# Patient Record
Sex: Male | Born: 1980 | Race: White | Hispanic: No | Marital: Married | Smoking: Former smoker
Health system: Southern US, Community
[De-identification: ages and names within clinical notes are randomized; demographics above are authoritative.]

## PROBLEM LIST (undated history)

## (undated) DIAGNOSIS — F1910 Other psychoactive substance abuse, uncomplicated: Secondary | ICD-10-CM

## (undated) DIAGNOSIS — IMO0002 Reserved for concepts with insufficient information to code with codable children: Secondary | ICD-10-CM

## (undated) DIAGNOSIS — R569 Unspecified convulsions: Secondary | ICD-10-CM

## (undated) HISTORY — PX: CHOLECYSTECTOMY: SHX55

---

## 2001-07-31 ENCOUNTER — Emergency Department (HOSPITAL_COMMUNITY): Admission: EM | Admit: 2001-07-31 | Discharge: 2001-07-31 | Payer: Self-pay | Admitting: Emergency Medicine

## 2001-10-30 ENCOUNTER — Emergency Department (HOSPITAL_COMMUNITY): Admission: EM | Admit: 2001-10-30 | Discharge: 2001-10-30 | Payer: Self-pay | Admitting: Emergency Medicine

## 2001-10-30 ENCOUNTER — Encounter: Payer: Self-pay | Admitting: Emergency Medicine

## 2002-01-07 ENCOUNTER — Encounter: Payer: Self-pay | Admitting: Emergency Medicine

## 2002-01-07 ENCOUNTER — Emergency Department (HOSPITAL_COMMUNITY): Admission: EM | Admit: 2002-01-07 | Discharge: 2002-01-07 | Payer: Self-pay | Admitting: Emergency Medicine

## 2002-07-03 ENCOUNTER — Emergency Department (HOSPITAL_COMMUNITY): Admission: EM | Admit: 2002-07-03 | Discharge: 2002-07-03 | Payer: Self-pay | Admitting: Emergency Medicine

## 2002-07-03 ENCOUNTER — Encounter: Payer: Self-pay | Admitting: Emergency Medicine

## 2003-03-17 ENCOUNTER — Emergency Department (HOSPITAL_COMMUNITY): Admission: EM | Admit: 2003-03-17 | Discharge: 2003-03-17 | Payer: Self-pay | Admitting: Emergency Medicine

## 2003-08-24 ENCOUNTER — Emergency Department (HOSPITAL_COMMUNITY): Admission: EM | Admit: 2003-08-24 | Discharge: 2003-08-24 | Payer: Self-pay | Admitting: Emergency Medicine

## 2003-10-12 ENCOUNTER — Emergency Department (HOSPITAL_COMMUNITY): Admission: EM | Admit: 2003-10-12 | Discharge: 2003-10-13 | Payer: Self-pay | Admitting: Emergency Medicine

## 2003-10-22 ENCOUNTER — Emergency Department (HOSPITAL_COMMUNITY): Admission: EM | Admit: 2003-10-22 | Discharge: 2003-10-22 | Payer: Self-pay | Admitting: *Deleted

## 2003-11-04 ENCOUNTER — Emergency Department (HOSPITAL_COMMUNITY): Admission: EM | Admit: 2003-11-04 | Discharge: 2003-11-05 | Payer: Self-pay | Admitting: Emergency Medicine

## 2003-12-01 ENCOUNTER — Ambulatory Visit (HOSPITAL_COMMUNITY): Admission: EM | Admit: 2003-12-01 | Discharge: 2003-12-01 | Payer: Self-pay | Admitting: Emergency Medicine

## 2003-12-31 ENCOUNTER — Emergency Department (HOSPITAL_COMMUNITY): Admission: EM | Admit: 2003-12-31 | Discharge: 2003-12-31 | Payer: Self-pay | Admitting: *Deleted

## 2004-06-02 ENCOUNTER — Emergency Department (HOSPITAL_COMMUNITY): Admission: EM | Admit: 2004-06-02 | Discharge: 2004-06-02 | Payer: Self-pay | Admitting: Emergency Medicine

## 2004-07-07 ENCOUNTER — Emergency Department (HOSPITAL_COMMUNITY): Admission: EM | Admit: 2004-07-07 | Discharge: 2004-07-07 | Payer: Self-pay | Admitting: Emergency Medicine

## 2004-08-27 ENCOUNTER — Emergency Department (HOSPITAL_COMMUNITY): Admission: EM | Admit: 2004-08-27 | Discharge: 2004-08-27 | Payer: Self-pay | Admitting: Emergency Medicine

## 2004-08-27 ENCOUNTER — Emergency Department (HOSPITAL_COMMUNITY): Admission: EM | Admit: 2004-08-27 | Discharge: 2004-08-27 | Payer: Self-pay | Admitting: *Deleted

## 2004-09-16 ENCOUNTER — Emergency Department (HOSPITAL_COMMUNITY): Admission: EM | Admit: 2004-09-16 | Discharge: 2004-09-16 | Payer: Self-pay | Admitting: Emergency Medicine

## 2006-10-05 ENCOUNTER — Emergency Department (HOSPITAL_COMMUNITY): Admission: EM | Admit: 2006-10-05 | Discharge: 2006-10-06 | Payer: Self-pay | Admitting: Emergency Medicine

## 2007-03-10 ENCOUNTER — Emergency Department (HOSPITAL_COMMUNITY): Admission: EM | Admit: 2007-03-10 | Discharge: 2007-03-10 | Payer: Self-pay | Admitting: Emergency Medicine

## 2007-04-13 ENCOUNTER — Emergency Department (HOSPITAL_COMMUNITY): Admission: EM | Admit: 2007-04-13 | Discharge: 2007-04-14 | Payer: Self-pay | Admitting: Emergency Medicine

## 2008-01-24 ENCOUNTER — Emergency Department (HOSPITAL_COMMUNITY): Admission: EM | Admit: 2008-01-24 | Discharge: 2008-01-25 | Payer: Self-pay | Admitting: Emergency Medicine

## 2008-08-21 ENCOUNTER — Emergency Department (HOSPITAL_COMMUNITY): Admission: EM | Admit: 2008-08-21 | Discharge: 2008-08-22 | Payer: Self-pay | Admitting: Emergency Medicine

## 2008-08-31 ENCOUNTER — Emergency Department (HOSPITAL_COMMUNITY): Admission: EM | Admit: 2008-08-31 | Discharge: 2008-08-31 | Payer: Self-pay | Admitting: Emergency Medicine

## 2008-09-29 ENCOUNTER — Emergency Department (HOSPITAL_BASED_OUTPATIENT_CLINIC_OR_DEPARTMENT_OTHER): Admission: EM | Admit: 2008-09-29 | Discharge: 2008-09-29 | Payer: Self-pay | Admitting: Emergency Medicine

## 2010-04-16 ENCOUNTER — Emergency Department (INDEPENDENT_AMBULATORY_CARE_PROVIDER_SITE_OTHER): Payer: Self-pay

## 2010-04-16 ENCOUNTER — Emergency Department (HOSPITAL_BASED_OUTPATIENT_CLINIC_OR_DEPARTMENT_OTHER)
Admission: EM | Admit: 2010-04-16 | Discharge: 2010-04-16 | Disposition: A | Discharge status: Home / Self Care | Payer: Self-pay | Attending: Emergency Medicine | Admitting: Emergency Medicine

## 2010-04-16 DIAGNOSIS — R071 Chest pain on breathing: Secondary | ICD-10-CM | POA: Insufficient documentation

## 2010-04-16 DIAGNOSIS — F17200 Nicotine dependence, unspecified, uncomplicated: Secondary | ICD-10-CM | POA: Insufficient documentation

## 2010-05-30 NOTE — Op Note (Signed)
NAMECHRISTOFFER, CURRIER NO.:  [id number]   MEDICAL RECORD NO.:  [id number]          PATIENT TYPE:  EMS   LOCATION:  MAJO                         FACILITY:  [facility]   PHYSICIAN:  Lubertha Basque. Dalldorf, M.D.DATE OF BIRTH:  12/18/1980   DATE OF PROCEDURE:  03/10/2007  DATE OF DISCHARGE:                               OPERATIVE REPORT   PREOPERATIVE DIAGNOSIS:  Right shoulder glenohumeral dislocation.   POSTOPERATIVE DIAGNOSIS:  Right shoulder glenohumeral dislocation.   PROCEDURE:  Right shoulder closed reduction under anesthesia.   ATTENDING SURGEON:  Lubertha Basque. Jerl Santos, M.D.   ANESTHESIA:  General.   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  He says this has come out  probably 40 times.  He has required reduction in the emergency room and  in the operating room through many different orthopedic surgeons in  town.  At this point his shoulder dislocated rolling over while  sleeping.  He presented to the [facility] emergency room.  The emergency  physicians could not relocate despite significant sedation in the  emergency room.  Orthopedics was consulted.  He is offered closed  reduction under general anesthesia.   SUMMARY OF FINDINGS AND PROCEDURE:  Under general anesthesia closed  reduction was performed with some moderate difficulty.  X-rays confirmed  reduction of the shoulder at the end of the case.   DESCRIPTION OF PROCEDURE:  The patient was taken to the operating suite  where general anesthetic was applied with IV medication and by mask.  He  was positioned in beach-chair position.  With some traction and  posterior pressure, the shoulder clunked back into place.  He exhibits  full, smooth motion thereafter.  Fluoroscopic and plain film view showed  a well reduced shoulder joint.  He was placed into a sling and swath.  Estimated blood loss was 0 and intraoperative fluids can be obtained  from anesthesia records.   DISPOSITION:  The patient was taken to recovery in stable condition.  He  was to go home same-day and follow up in the office in less than a week.      Lubertha Basque Jerl Santos, M.D.  Electronically Signed     PGD/MEDQ  D:  03/10/2007  T:  03/11/2007  Job:  [id number]

## 2010-06-02 NOTE — Op Note (Signed)
NAMEMarland Kitchen  MACGREGOR, AESCHLIMAN NO.:  [id number]   MEDICAL RECORD NO.:  [id number]          PATIENT TYPE:  EMS   LOCATION:  ED                           FACILITY:  [facility]   PHYSICIAN:  Vania Rea. Supple, M.D.  DATE OF BIRTH:  [birth year]   DATE OF PROCEDURE:  12/01/2003  DATE OF DISCHARGE:                                 OPERATIVE REPORT   PREOPERATIVE DIAGNOSIS:  Chronic recurrent left shoulder dislocations with a  currently irreducible left shoulder anterior and inferior dislocation.   POSTOPERATIVE DIAGNOSIS:  Chronic recurrent left shoulder dislocations with  a currently irreducible left shoulder anterior and inferior dislocation.   PROCEDURE:  Closed reduction of left shoulder anterior-inferior dislocation.   SURGEON OF RECORD:  Francena Hanly, M.D.   ANESTHESIA:  Mask general.   INTRAOPERATIVE FINDINGS:  Gross instability of the glenohumeral ligamentous  complex allowing recurrent dislocations of left shoulder with simply gravity  applied.   BRIEF HISTORY:  Mr. Chopin is a 30 year old male, who has had chronic  recurrent bilateral shoulder dislocations and by his report, has had over 30  dislocations on the left and 15 on the right.  He has been to the [facility] Emergency Room over 8 times in the last year with recurrent  dislocations of the left shoulder.  He has followed up with a number of  different local orthopedists but has been unwilling to undergo any type of  surgical stabilization.  Presents today complaining of left shoulder  dislocation and has been evaluated in the ER and had multiple attempts by  the ER physician with closed reduction without success.  Orthopedics is  consulted and presents now for a planned closed reduction under anesthesia.   Preoperatively, I discussed with Mr. Postlewaite treatment options and risks  versus benefits thereof.  Possible complications of recurrent dislocation  and the likely need for some type of surgical  stabilization are reviewed.  He understands, accepts, and agrees with our planned procedure.   PROCEDURE IN DETAIL:  After undergoing routine preoperative evaluation, the  patient brought to the operating room and underwent the induction of a mask  general anesthesia with appropriate relaxation techniques.  The left  shoulder readily reduced and on examination under anesthesia, the left  shoulder was grossly unstable with recurrent anterior-inferior dislocation  with simply elevation of the arm and gravity applied.  There was no obvious  posterior instability.  Fluoroscopic images were then obtained with the  shoulder both in proper reduction as well as with recurrent dislocation.  Due to the extreme instability of the shoulder, it was unclear whether the  shoulder would remain reduced, and most likely he will require some type of  surgical stabilization.   At this point, the patient was then awakened and taken to the recovery room  in stable condition with a shoulder immobilizer applied to the left upper  extremity.     Sage.Pita   KMS/MEDQ  D:  12/01/2003  T:  12/01/2003  Job:  [id number]

## 2010-10-09 LAB — BASIC METABOLIC PANEL
Chloride: 98
GFR calc Af Amer: >60
GFR calc non Af Amer: >60
Potassium: 3.8
Sodium: 134 — ABNORMAL LOW

## 2010-10-09 LAB — CBC
HCT: 47.9
MCV: 99
RBC: 4.84
WBC: 9.9

## 2010-10-09 LAB — DIFFERENTIAL
Eosinophils Absolute: 0.1
Eosinophils Relative: 1
Lymphocytes Relative: 19
Lymphs Abs: 1.9
Monocytes Relative: 6

## 2010-10-09 LAB — URINALYSIS, ROUTINE W REFLEX MICROSCOPIC
Glucose, UA: NEGATIVE
Hgb urine dipstick: NEGATIVE
Protein, ur: NEGATIVE
Specific Gravity, Urine: 1.043 — ABNORMAL HIGH
pH: 6

## 2010-10-09 LAB — ETHANOL: Alcohol, Ethyl (B): <5

## 2010-10-09 LAB — RAPID URINE DRUG SCREEN, HOSP PERFORMED
Barbiturates: NOT DETECTED
Benzodiazepines: POSITIVE — AB

## 2010-10-26 LAB — ETHANOL: Alcohol, Ethyl (B): 286 — ABNORMAL HIGH

## 2011-09-20 ENCOUNTER — Emergency Department (HOSPITAL_COMMUNITY)
Admission: EM | Admit: 2011-09-20 | Discharge: 2011-09-20 | Disposition: A | Discharge status: Home / Self Care | Payer: Self-pay | Attending: Emergency Medicine | Admitting: Emergency Medicine

## 2011-09-20 ENCOUNTER — Encounter (HOSPITAL_COMMUNITY): Payer: Self-pay | Admitting: Emergency Medicine

## 2011-09-20 DIAGNOSIS — F17200 Nicotine dependence, unspecified, uncomplicated: Secondary | ICD-10-CM | POA: Insufficient documentation

## 2011-09-20 DIAGNOSIS — R569 Unspecified convulsions: Secondary | ICD-10-CM | POA: Insufficient documentation

## 2011-09-20 DIAGNOSIS — Z760 Encounter for issue of repeat prescription: Secondary | ICD-10-CM | POA: Insufficient documentation

## 2011-09-20 HISTORY — DX: Unspecified convulsions: R56.9

## 2011-09-20 MED ORDER — PHENYTOIN SODIUM EXTENDED 100 MG PO CAPS: 100.0000 mg | ORAL_CAPSULE | Freq: Three times a day (TID) | ORAL | Status: AC

## 2011-09-20 NOTE — ED Notes (Signed)
Needs dilantin refill lost insurance

## 2011-09-20 NOTE — ED Notes (Signed)
Pt d/c home in NAD. Pt voiced understanding of d/c instructions and follow up care.  

## 2011-09-20 NOTE — ED Provider Notes (Signed)
History   Scribed for [name], MD, the patient was seen in room TR08C/TR08C . This chart was scribed by [name].    CSN: [id number]  Arrival date & time 09/20/11  1107   First MD Initiated Contact with Patient 09/20/11 1150      Chief Complaint  Patient presents with  . Medication Refill    (Consider location/radiation/quality/duration/timing/severity/associated sxs/prior treatment) The history is provided by the patient.   Kurt Lindsey is a 31 y.o. male who presents to the Emergency Department for a refill of Dilantin today. Pt states he recently saw a neurologist who prescribed him Lamictal for his seizures, but he did not fill it because he "could not afford it." Pt is requesting to be put on Dilantin again.   Past Medical History  Diagnosis Date  . Seizures     No past surgical history on file.  No family history on file.  History  Substance Use Topics  . Smoking status: Current Everyday Smoker  . Smokeless tobacco: Not on file  . Alcohol Use: Yes      Review of Systems  Constitutional: Negative.   HENT: Negative.   Respiratory: Negative.   Cardiovascular: Negative.   Gastrointestinal: Negative.   Musculoskeletal: Negative.   Skin: Negative.   Neurological: Negative.   Hematological: Negative.   Psychiatric/Behavioral: Negative.   All other systems reviewed and are negative.    Allergies  Review of patient's allergies indicates no known allergies.  Home Medications   Current Outpatient Rx  Name Route Sig Dispense Refill  . PHENYTOIN SODIUM EXTENDED 100 MG PO CAPS Oral Take 100 mg by mouth daily.      BP 135/85  Pulse 89  Temp 98.3 F (36.8 C)  Resp 16  SpO2 99%  Physical Exam  Nursing note and vitals reviewed. Constitutional: He is oriented to person, place, and time. He appears well-developed and well-nourished.  HENT:  Head: Normocephalic and atraumatic.  Eyes: Conjunctivae and EOM are normal.  Neck: Normal range  of motion.  Cardiovascular: Normal rate.   Pulmonary/Chest: Effort normal.  Abdominal: Soft.  Musculoskeletal: Normal range of motion.  Neurological: He is alert and oriented to person, place, and time.  Skin: Skin is warm and dry.  Psychiatric: He has a normal mood and affect.    ED Course  Procedures (including critical care time)  Labs Reviewed - No data to display No results found.   No diagnosis found.    MDM  pts dilantin rx refilled and he was given f/u         [name], MD 09/20/11 1208

## 2011-09-20 NOTE — ED Notes (Signed)
Pt needs med refill, has not run out yet, pt denies any other complaints at this time.

## 2011-12-31 ENCOUNTER — Emergency Department (HOSPITAL_COMMUNITY)
Admission: EM | Admit: 2011-12-31 | Discharge: 2012-01-01 | Disposition: A | Discharge status: Home / Self Care | Payer: Self-pay | Attending: Emergency Medicine | Admitting: Emergency Medicine

## 2011-12-31 ENCOUNTER — Encounter (HOSPITAL_COMMUNITY): Payer: Self-pay | Admitting: Emergency Medicine

## 2011-12-31 DIAGNOSIS — W292XXA Contact with other powered household machinery, initial encounter: Secondary | ICD-10-CM | POA: Insufficient documentation

## 2011-12-31 DIAGNOSIS — S61012A Laceration without foreign body of left thumb without damage to nail, initial encounter: Secondary | ICD-10-CM

## 2011-12-31 DIAGNOSIS — Y9389 Activity, other specified: Secondary | ICD-10-CM | POA: Insufficient documentation

## 2011-12-31 DIAGNOSIS — G40909 Epilepsy, unspecified, not intractable, without status epilepticus: Secondary | ICD-10-CM | POA: Insufficient documentation

## 2011-12-31 DIAGNOSIS — F17200 Nicotine dependence, unspecified, uncomplicated: Secondary | ICD-10-CM | POA: Insufficient documentation

## 2011-12-31 DIAGNOSIS — S61209A Unspecified open wound of unspecified finger without damage to nail, initial encounter: Secondary | ICD-10-CM | POA: Insufficient documentation

## 2011-12-31 DIAGNOSIS — Z79899 Other long term (current) drug therapy: Secondary | ICD-10-CM | POA: Insufficient documentation

## 2011-12-31 DIAGNOSIS — Y92009 Unspecified place in unspecified non-institutional (private) residence as the place of occurrence of the external cause: Secondary | ICD-10-CM | POA: Insufficient documentation
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
## 2012-01-01 MED ORDER — LIDOCAINE HCL (PF) 1 % IJ SOLN
5.0000 mL | Freq: Once | INTRAMUSCULAR | Status: AC
  Administered 2012-01-01: 5 mL via INTRADERMAL
  Filled 2012-01-01: qty 5

## 2012-01-01 MED ORDER — TRAMADOL-ACETAMINOPHEN 37.5-325 MG PO TABS: ORAL_TABLET | ORAL | Status: DC
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
## 2012-01-01 NOTE — ED Notes (Signed)
Triple antibiotic and dressing applied.

## 2012-01-01 NOTE — ED Notes (Signed)
Patient states he was using a sharp kitchen knife and cut his left thumb.  Area cleaned and dressing applied
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
## 2015-03-07 ENCOUNTER — Emergency Department (HOSPITAL_COMMUNITY)
Admission: EM | Admit: 2015-03-07 | Discharge: 2015-03-07 | Disposition: A | Discharge status: Home / Self Care | Payer: Self-pay | Attending: Emergency Medicine | Admitting: Emergency Medicine

## 2015-03-07 ENCOUNTER — Encounter (HOSPITAL_COMMUNITY): Payer: Self-pay

## 2015-03-07 DIAGNOSIS — F17200 Nicotine dependence, unspecified, uncomplicated: Secondary | ICD-10-CM | POA: Insufficient documentation

## 2015-03-07 DIAGNOSIS — F1123 Opioid dependence with withdrawal: Secondary | ICD-10-CM | POA: Insufficient documentation

## 2015-03-07 LAB — CBC WITH DIFFERENTIAL/PLATELET
Basophils Absolute: 0 10*3/uL (ref 0.0–0.1)
Basophils Relative: 0 %
Eosinophils Absolute: 0.2 10*3/uL (ref 0.0–0.7)
Eosinophils Relative: 3 %
HEMATOCRIT: 46.3 % (ref 39.0–52.0)
Hemoglobin: 15.9 g/dL (ref 13.0–17.0)
LYMPHS PCT: 31 %
Lymphs Abs: 2.2 10*3/uL (ref 0.7–4.0)
MCH: 33.2 pg (ref 26.0–34.0)
MCHC: 34.3 g/dL (ref 30.0–36.0)
MCV: 96.7 fL (ref 78.0–100.0)
MONO ABS: 0.6 10*3/uL (ref 0.1–1.0)
MONOS PCT: 9 %
NEUTROS ABS: 4 10*3/uL (ref 1.7–7.7)
Neutrophils Relative %: 57 %
Platelets: 211 10*3/uL (ref 150–400)
RBC: 4.79 MIL/uL (ref 4.22–5.81)
RDW: 13.4 % (ref 11.5–15.5)
WBC: 7 10*3/uL (ref 4.0–10.5)

## 2015-03-07 LAB — ETHANOL: Alcohol, Ethyl (B): <5 mg/dL (ref <5)

## 2015-03-07 LAB — RAPID URINE DRUG SCREEN, HOSP PERFORMED
Amphetamines: NOT DETECTED
BARBITURATES: NOT DETECTED
Benzodiazepines: POSITIVE — AB
Cocaine: POSITIVE — AB
Opiates: POSITIVE — AB
Tetrahydrocannabinol: NOT DETECTED

## 2015-03-07 LAB — COMPREHENSIVE METABOLIC PANEL
ALBUMIN: 3.8 g/dL (ref 3.5–5.0)
ALT: 80 U/L — ABNORMAL HIGH (ref 17–63)
AST: 41 U/L (ref 15–41)
Alkaline Phosphatase: 170 U/L — ABNORMAL HIGH (ref 38–126)
Anion gap: 10 (ref 5–15)
BILIRUBIN TOTAL: 0.3 mg/dL (ref 0.3–1.2)
BUN: 19 mg/dL (ref 6–20)
CHLORIDE: 101 mmol/L (ref 101–111)
CO2: 26 mmol/L (ref 22–32)
Calcium: 9.1 mg/dL (ref 8.9–10.3)
Creatinine, Ser: 0.91 mg/dL (ref 0.61–1.24)
GFR calc Af Amer: >60 mL/min (ref >60)
GFR calc non Af Amer: >60 mL/min (ref >60)
GLUCOSE: 115 mg/dL — AB (ref 65–99)
POTASSIUM: 4 mmol/L (ref 3.5–5.1)
Sodium: 137 mmol/L (ref 135–145)
TOTAL PROTEIN: 7 g/dL (ref 6.5–8.1)

## 2015-03-07 LAB — SALICYLATE LEVEL: Salicylate Lvl: <4 mg/dL (ref 2.8–30.0)

## 2015-03-07 LAB — ACETAMINOPHEN LEVEL

## 2015-03-07 NOTE — ED Notes (Signed)
Patient left without being seen.

## 2015-03-07 NOTE — ED Notes (Signed)
Patient here requesting detox for opiate and benzos, here for medical clearance. Denies any SI

## 2015-10-05 ENCOUNTER — Encounter (HOSPITAL_BASED_OUTPATIENT_CLINIC_OR_DEPARTMENT_OTHER): Payer: Self-pay

## 2015-10-05 ENCOUNTER — Emergency Department (HOSPITAL_BASED_OUTPATIENT_CLINIC_OR_DEPARTMENT_OTHER)
Admission: EM | Admit: 2015-10-05 | Discharge: 2015-10-06 | Disposition: A | Discharge status: Home / Self Care | Payer: Self-pay | Attending: Emergency Medicine | Admitting: Emergency Medicine

## 2015-10-05 DIAGNOSIS — K2970 Gastritis, unspecified, without bleeding: Secondary | ICD-10-CM | POA: Insufficient documentation

## 2015-10-05 DIAGNOSIS — Z79899 Other long term (current) drug therapy: Secondary | ICD-10-CM | POA: Insufficient documentation

## 2015-10-05 DIAGNOSIS — F17200 Nicotine dependence, unspecified, uncomplicated: Secondary | ICD-10-CM | POA: Insufficient documentation

## 2015-10-05 DIAGNOSIS — F1910 Other psychoactive substance abuse, uncomplicated: Secondary | ICD-10-CM | POA: Insufficient documentation

## 2015-10-05 LAB — CBC WITH DIFFERENTIAL/PLATELET
Basophils Absolute: 0 10*3/uL (ref 0.0–0.1)
Basophils Relative: 0 %
EOS PCT: 2 %
Eosinophils Absolute: 0.2 10*3/uL (ref 0.0–0.7)
HEMATOCRIT: 48.3 % (ref 39.0–52.0)
Hemoglobin: 17 g/dL (ref 13.0–17.0)
LYMPHS ABS: 2.6 10*3/uL (ref 0.7–4.0)
LYMPHS PCT: 35 %
MCH: 35.1 pg — AB (ref 26.0–34.0)
MCHC: 35.2 g/dL (ref 30.0–36.0)
MCV: 99.6 fL (ref 78.0–100.0)
MONO ABS: 0.7 10*3/uL (ref 0.1–1.0)
Monocytes Relative: 10 %
NEUTROS ABS: 4 10*3/uL (ref 1.7–7.7)
Neutrophils Relative %: 53 %
PLATELETS: 194 10*3/uL (ref 150–400)
RBC: 4.85 MIL/uL (ref 4.22–5.81)
RDW: 13.5 % (ref 11.5–15.5)
WBC: 7.6 10*3/uL (ref 4.0–10.5)

## 2015-10-05 LAB — COMPREHENSIVE METABOLIC PANEL
ALT: 63 U/L (ref 17–63)
AST: 45 U/L — AB (ref 15–41)
Albumin: 3.9 g/dL (ref 3.5–5.0)
Alkaline Phosphatase: 108 U/L (ref 38–126)
Anion gap: 7 (ref 5–15)
BILIRUBIN TOTAL: 0.7 mg/dL (ref 0.3–1.2)
BUN: 15 mg/dL (ref 6–20)
CALCIUM: 8.7 mg/dL — AB (ref 8.9–10.3)
CHLORIDE: 106 mmol/L (ref 101–111)
CO2: 24 mmol/L (ref 22–32)
CREATININE: 0.81 mg/dL (ref 0.61–1.24)
Glucose, Bld: 81 mg/dL (ref 65–99)
Potassium: 4 mmol/L (ref 3.5–5.1)
Sodium: 137 mmol/L (ref 135–145)
TOTAL PROTEIN: 7.1 g/dL (ref 6.5–8.1)

## 2015-10-05 LAB — LIPASE, BLOOD: Lipase: 31 U/L (ref 11–51)

## 2015-10-05 LAB — ETHANOL: ALCOHOL ETHYL (B): 93 mg/dL — AB (ref <5)

## 2015-10-05 MED ORDER — ONDANSETRON 4 MG PO TBDP
4.0000 mg | ORAL_TABLET | Freq: Once | ORAL | Status: AC
  Administered 2015-10-05: 4 mg via ORAL
  Filled 2015-10-05: qty 1

## 2015-10-05 MED ORDER — GI COCKTAIL ~~LOC~~
30.0000 mL | Freq: Once | ORAL | Status: AC
  Administered 2015-10-05: 30 mL via ORAL
  Filled 2015-10-05: qty 30

## 2015-10-05 MED ORDER — PANTOPRAZOLE SODIUM 40 MG PO TBEC
80.0000 mg | DELAYED_RELEASE_TABLET | Freq: Once | ORAL | Status: AC
  Administered 2015-10-05: 80 mg via ORAL
  Filled 2015-10-05: qty 2

## 2015-10-05 NOTE — ED Provider Notes (Signed)
By signing my name below, I, Freida BusmanDiana Omoyeni, attest that this documentation has been prepared under the direction and in the presence of Teyanna Thielman N Kateryna Grantham, DO . Electronically Signed: Freida Busmaniana Omoyeni, Scribe. 10/05/2015. 11:10 PM.  TIME SEEN: 11:03 PM  CHIEF COMPLAINT:  Chief Complaint  Patient presents with  . Abdominal Pain    HPI:   HPI Comments:  Kurt Lindsey is a 35 y.o. male who presents to the Emergency Department complaining of  intermittent abdominal pain x 6 months. He notes his pain worsened today. He describes a sharp pain to the epigastric region; notes this pain radiates across his upper abdomen. Pt reports associated nausea.  He has been taking a lansoprazole 15mg  once a day without relief. He denies vomiting, diarrhea, melena, bloody stools, dysuria, hematuria, fever, and chills. He also denies h/o abdominal surgeries. He notes he has taken ibuprofen almost daily and also takes  [facility] powder often. Pt drinks ETOH ~ 3 times a week; he drank about 6-7 beers this evening. 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    ROS: See HPI Constitutional: no fever  Eyes: no drainage  ENT: no runny nose   Cardiovascular:  no chest pain  Resp: no SOB  GI: no vomiting GU: no dysuria Integumentary: no rash  Allergy: no hives  Musculoskeletal: no leg swelling  Neurological: no slurred speech ROS otherwise negative  PAST MEDICAL HISTORY/PAST SURGICAL HISTORY:  Past Medical History:  Diagnosis Date  . Seizures (HCC)     MEDICATIONS:  Prior to Admission medications   Medication Sig Start Date End Date Taking? Authorizing Provider  LANSOPRAZOLE PO Take by mouth.   Yes Historical Provider, MD  phenytoin (DILANTIN) 100 MG ER capsule Take 1 capsule (100 mg total) by mouth 3 (three) times daily. 09/20/11 09/19/12  Lorre NickAnthony Allen, MD    ALLERGIES:  No Known Allergies  SOCIAL HISTORY:  Social History  Substance Use Topics  . Smoking status: Current Every Day Smoker  . Smokeless tobacco:  Never Used  . Alcohol use Yes     Comment: weekly    FAMILY HISTORY: No family history on file.  EXAM: BP 123/83 (BP Location: Left Arm)   Pulse 88   Temp 97.6 F (36.4 C) (Oral)   Resp 18   Ht 5\' 8"  (1.727 m)   Wt 190 lb (86.2 kg)   SpO2 96%   BMI 28.89 kg/m  CONSTITUTIONAL: Alert and oriented and responds appropriately to questions. Well-appearing; well-nourished;  Intoxicated; smells of ETOH; slightly slurred speech  HEAD: Normocephalic EYES: Conjunctivae clear, PERRL ENT: normal nose; no rhinorrhea; moist mucous membranes NECK: Supple, no meningismus, no LAD  CARD: RRR; S1 and S2 appreciated; no murmurs, no clicks, no rubs, no gallops RESP: Normal chest excursion without splinting or tachypnea; breath sounds clear and equal bilaterally; no wheezes, no rhonchi, no rales, no hypoxia or respiratory distress, speaking full sentences ABD/GI: Normal bowel sounds; non-distended; soft, no rebound, no guarding, no peritoneal signs. There is tenderness in the epigastric region  BACK:  The back appears normal and is non-tender to palpation, there is no CVA tenderness EXT: Normal ROM in all joints; non-tender to palpation; no edema; normal capillary refill; no cyanosis, no calf tenderness or swelling    SKIN: Normal color for age and race; warm; no rash NEURO: Moves all extremities equally, sensation to light touch intact diffusely, cranial nerves II through XII intact PSYCH: The patient's mood and manner are appropriate. Grooming and personal hygiene are appropriate.  MEDICAL DECISION MAKING: Pt here with upper abdominal pain.  Differential diagnosis includes PUD, gastritis, pancreatitis.  Doubt cholecystitis, appendicitis.  Will check labs, urine, treat with GI cocktail, Protonix, Zofran.  At this time I do not feel he needs emergent imaging.  I think he will need referral to GI.  Recommended cessation of NSAIDs and ETOH.  ED PROGRESS: 12:25 AM  Pt's labs unremarkable other than alcohol  level of 93. Urine shows no blood or sign of infection. Urine drug screen is positive for benzodiazepines, opiates and amphetamines. Patient reports feeling better. Had a frank discussion with him and his significant other about his polysubstance abuse. 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 States he does use oral opiates, Adderall, benzodiazepines as "coping mechanisms". Have offered him outpatient resources for rehabilitation. Discussed with him I'm concerned about this behavior and that he is replacing his IV drug abuse with oral medications.  I suspect that his symptoms are secondary to gastritis. We'll discharge him on Protonix and with Carafate, Zofran. Will give outpatient GI follow-up.  At this time, I do not feel there is any life-threatening condition present. I have reviewed and discussed all results (EKG, imaging, lab, urine as appropriate), exam findings with patient/family. I have reviewed nursing notes and appropriate previous records.  I feel the patient is safe to be discharged home without further emergent workup and can continue workup as an outpatient as needed. Discussed usual and customary return precautions. Patient/family verbalize understanding and are comfortable with this plan.  Outpatient follow-up has been provided. All questions have been answered.     I personally performed the services described in this documentation, which was scribed in my presence. The recorded information has been reviewed and is accurate.     Layla Maw Liesel Peckenpaugh, DO 10/06/15 0030

## 2015-10-05 NOTE — ED Triage Notes (Addendum)
C/o abd pain, nausea x 40 min-pt with slurred speech/smells of ETOH-admits to "6-7 beers" PTA-steady gait-NAD

## 2015-10-06 LAB — URINALYSIS, ROUTINE W REFLEX MICROSCOPIC
Bilirubin Urine: NEGATIVE
Glucose, UA: NEGATIVE mg/dL
Hgb urine dipstick: NEGATIVE
Ketones, ur: NEGATIVE mg/dL
LEUKOCYTES UA: NEGATIVE
Nitrite: NEGATIVE
PROTEIN: NEGATIVE mg/dL
Specific Gravity, Urine: 1.028 (ref 1.005–1.030)
pH: 5 (ref 5.0–8.0)

## 2015-10-06 LAB — RAPID URINE DRUG SCREEN, HOSP PERFORMED
AMPHETAMINES: POSITIVE — AB
BENZODIAZEPINES: POSITIVE — AB
Barbiturates: NOT DETECTED
Cocaine: NOT DETECTED
OPIATES: POSITIVE — AB
Tetrahydrocannabinol: NOT DETECTED

## 2015-10-06 MED ORDER — ONDANSETRON 4 MG PO TBDP: 4.0000 mg | ORAL_TABLET | Freq: Three times a day (TID) | ORAL | 0 refills | Status: DC | PRN

## 2015-10-06 MED ORDER — PANTOPRAZOLE SODIUM 40 MG PO TBEC: 40.0000 mg | DELAYED_RELEASE_TABLET | Freq: Every day | ORAL | 1 refills | Status: DC

## 2015-10-06 MED ORDER — SUCRALFATE 1 G PO TABS: 1.0000 g | ORAL_TABLET | Freq: Three times a day (TID) | ORAL | 1 refills | Status: DC

## 2015-10-06 NOTE — Discharge Instructions (Signed)
Please avoid alcohol, NSAIDs such as ibuprofen, aspirin, Aleve, Goody powders.   To find a primary care or specialty doctor please call [phone] or [phone] to access "[facility] Find a Doctor Service."  You may also go on the [facility] website at [email or web address]  There are also multiple [facility] and [facility] practices throughout the [place] that are frequently accepting new patients. You may find a clinic that is close to your home and contact them.  [facility] -  [address] [place] [phone]  [facility] in [place] (also locations in [place] and [place]) -  [address] [place] [address] [phone]  [facility] -  [address] [place] [address] [phone]

## 2015-10-06 NOTE — ED Notes (Signed)
Pt able to drink sprite w/o emesis

## 2016-04-09 DIAGNOSIS — R1013 Epigastric pain: Secondary | ICD-10-CM | POA: Insufficient documentation

## 2016-04-09 DIAGNOSIS — F17200 Nicotine dependence, unspecified, uncomplicated: Secondary | ICD-10-CM | POA: Insufficient documentation

## 2016-04-09 DIAGNOSIS — R112 Nausea with vomiting, unspecified: Secondary | ICD-10-CM | POA: Insufficient documentation

## 2016-04-10 ENCOUNTER — Emergency Department (HOSPITAL_BASED_OUTPATIENT_CLINIC_OR_DEPARTMENT_OTHER)
Admission: EM | Admit: 2016-04-10 | Discharge: 2016-04-10 | Disposition: A | Discharge status: Home / Self Care | Payer: Self-pay | Attending: Emergency Medicine | Admitting: Emergency Medicine

## 2016-04-10 ENCOUNTER — Encounter (HOSPITAL_BASED_OUTPATIENT_CLINIC_OR_DEPARTMENT_OTHER): Payer: Self-pay

## 2016-04-10 DIAGNOSIS — R1013 Epigastric pain: Secondary | ICD-10-CM

## 2016-04-10 HISTORY — DX: Other psychoactive substance abuse, uncomplicated: F19.10

## 2016-04-10 HISTORY — DX: Reserved for concepts with insufficient information to code with codable children: IMO0002

## 2016-04-10 LAB — CBC WITH DIFFERENTIAL/PLATELET
Basophils Absolute: 0 10*3/uL (ref 0.0–0.1)
Basophils Relative: 0 %
EOS ABS: 0.2 10*3/uL (ref 0.0–0.7)
Eosinophils Relative: 2 %
HCT: 46.6 % (ref 39.0–52.0)
HEMOGLOBIN: 16.4 g/dL (ref 13.0–17.0)
LYMPHS ABS: 2.6 10*3/uL (ref 0.7–4.0)
LYMPHS PCT: 34 %
MCH: 33.7 pg (ref 26.0–34.0)
MCHC: 35.2 g/dL (ref 30.0–36.0)
MCV: 95.7 fL (ref 78.0–100.0)
Monocytes Absolute: 1.1 10*3/uL — ABNORMAL HIGH (ref 0.1–1.0)
Monocytes Relative: 14 %
NEUTROS PCT: 50 %
Neutro Abs: 3.8 10*3/uL (ref 1.7–7.7)
Platelets: 223 10*3/uL (ref 150–400)
RBC: 4.87 MIL/uL (ref 4.22–5.81)
RDW: 13.6 % (ref 11.5–15.5)
WBC: 7.6 10*3/uL (ref 4.0–10.5)

## 2016-04-10 LAB — COMPREHENSIVE METABOLIC PANEL
ALT: 51 U/L (ref 17–63)
AST: 30 U/L (ref 15–41)
Albumin: 3.8 g/dL (ref 3.5–5.0)
Alkaline Phosphatase: 120 U/L (ref 38–126)
Anion gap: 7 (ref 5–15)
BUN: 21 mg/dL — AB (ref 6–20)
CHLORIDE: 105 mmol/L (ref 101–111)
CO2: 26 mmol/L (ref 22–32)
Calcium: 9.1 mg/dL (ref 8.9–10.3)
Creatinine, Ser: 0.8 mg/dL (ref 0.61–1.24)
Glucose, Bld: 98 mg/dL (ref 65–99)
POTASSIUM: 3.8 mmol/L (ref 3.5–5.1)
Sodium: 138 mmol/L (ref 135–145)
Total Bilirubin: 0.4 mg/dL (ref 0.3–1.2)
Total Protein: 7.1 g/dL (ref 6.5–8.1)

## 2016-04-10 LAB — URINALYSIS, ROUTINE W REFLEX MICROSCOPIC
Bilirubin Urine: NEGATIVE
Glucose, UA: NEGATIVE mg/dL
Hgb urine dipstick: NEGATIVE
Ketones, ur: NEGATIVE mg/dL
Leukocytes, UA: NEGATIVE
NITRITE: NEGATIVE
Protein, ur: NEGATIVE mg/dL
SPECIFIC GRAVITY, URINE: 1.014 (ref 1.005–1.030)
pH: 7 (ref 5.0–8.0)

## 2016-04-10 LAB — LIPASE, BLOOD: LIPASE: 49 U/L (ref 11–51)

## 2016-04-10 MED ORDER — ONDANSETRON 8 MG PO TBDP: 8.0000 mg | ORAL_TABLET | Freq: Three times a day (TID) | ORAL | 0 refills | Status: AC | PRN

## 2016-04-10 MED ORDER — SUCRALFATE 1 G PO TABS: 1.0000 g | ORAL_TABLET | Freq: Three times a day (TID) | ORAL | 1 refills | Status: AC

## 2016-04-10 MED ORDER — PANTOPRAZOLE SODIUM 40 MG PO TBEC: DELAYED_RELEASE_TABLET | ORAL | 1 refills | Status: AC

## 2016-04-10 MED ORDER — PANTOPRAZOLE SODIUM 40 MG IV SOLR
40.0000 mg | Freq: Once | INTRAVENOUS | Status: AC
  Administered 2016-04-10: 40 mg via INTRAVENOUS
  Filled 2016-04-10: qty 40

## 2016-04-10 MED ORDER — ONDANSETRON HCL 4 MG/2ML IJ SOLN
INTRAMUSCULAR | Status: AC
  Administered 2016-04-10: 4 mg via INTRAVENOUS
  Filled 2016-04-10: qty 2

## 2016-04-10 MED ORDER — SUCRALFATE 1 G PO TABS
ORAL_TABLET | ORAL | Status: AC
  Filled 2016-04-10: qty 1

## 2016-04-10 MED ORDER — SUCRALFATE 1 G PO TABS
1.0000 g | ORAL_TABLET | Freq: Once | ORAL | Status: AC
  Administered 2016-04-10: 1 g via ORAL

## 2016-04-10 MED ORDER — ONDANSETRON HCL 4 MG/2ML IJ SOLN: 4.0000 mg | Freq: Once | INTRAMUSCULAR | Status: DC

## 2016-04-10 MED ORDER — ONDANSETRON HCL 4 MG/2ML IJ SOLN
4.0000 mg | Freq: Once | INTRAMUSCULAR | Status: AC
  Administered 2016-04-10: 4 mg via INTRAVENOUS

## 2016-04-10 MED ORDER — GI COCKTAIL ~~LOC~~
30.0000 mL | Freq: Once | ORAL | Status: AC
  Administered 2016-04-10: 30 mL via ORAL
  Filled 2016-04-10: qty 30

## 2016-04-10 NOTE — ED Provider Notes (Addendum)
[facility] Provider Note: Kurt Dell, MD, FACEP  CSN: [id number] MRN: [id number] ARRIVAL: 04/09/16 at 2357 ROOM: MH12/MH12  By signing my name below, I, Kurt Lindsey, attest that this documentation has been prepared under the direction and in the presence of Paula Libra, MD . Electronically Signed: Teofilo Lindsey, ED Scribe. 04/10/2016. 12:48 AM.   CHIEF COMPLAINT  Abdominal Pain   HISTORY OF PRESENT ILLNESS  Kurt Lindsey is a 36 y.o. male with PMHx of gastritis and polysubstance abuse with complaint of constant epigastric abdominal pain x 2 hours. He describes the pain as severe. He states that the pain is similar to when he was treated for gastritis last year. Pt complains of associated nausea, vomiting x 1. Pt denies any EtOH use but does take ibuprofen on a fairly regular basis. He denies diarrhea.    Past Medical History:  Diagnosis Date  . Seizures (HCC)    last seizure was 10/2014  . Substance abuse   . Ulcer (HCC)     History reviewed. No pertinent surgical history.  No family history on file.  Social History  Substance Use Topics  . Smoking status: Current Every Day Smoker  . Smokeless tobacco: Never Used  . Alcohol use Yes     Comment: weekly    Prior to Admission medications   Medication Sig Start Date End Date Taking? Authorizing Provider  LANSOPRAZOLE PO Take by mouth.    Historical Provider, MD  ondansetron (ZOFRAN-ODT) 8 MG disintegrating tablet Take 1 tablet (8 mg total) by mouth every 8 (eight) hours as needed for nausea or vomiting. 04/10/16   Paula Libra, MD  pantoprazole (PROTONIX) 40 MG tablet Take one tablet daily at least 30 minutes before first dose of Carafate. 04/10/16   Hajime Asfaw, MD  phenytoin (DILANTIN) 100 MG ER capsule Take 1 capsule (100 mg total) by mouth 3 (three) times daily. 09/20/11 09/19/12  Lorre Nick, MD  sucralfate (CARAFATE) 1 g tablet Take 1 tablet (1 g total) by mouth 4 (four) times daily -  with  meals and at bedtime. 04/10/16   Paula Libra, MD    Allergies Patient has no known allergies.   REVIEW OF SYSTEMS  Negative except as noted here or in the History of Present Illness.   PHYSICAL EXAMINATION  Initial Vital Signs Blood pressure (!) 146/98, pulse 60, temperature 97.6 F (36.4 C), temperature source Oral, resp. rate 18, height 5\' 8"  (1.727 m), weight 200 lb (90.7 kg), SpO2 100 %.  Examination General: Well-developed, well-nourished male in no acute distress; appearance consistent with age of record HENT: normocephalic; atraumatic Eyes: pupils equal, round and reactive to light; extraocular muscles intact Neck: supple Heart: regular rate and rhythm Lungs: clear to auscultation bilaterally Abdomen: soft; nondistended; mild upper abdominal tenderness; no masses or hepatosplenomegaly; bowel sounds present Extremities: No deformity; full range of motion; pulses normal Neurologic: Awake, alert and oriented; motor function intact in all extremities and symmetric; no facial droop Skin: Warm and dry Psychiatric: Flat affect   RESULTS  Summary of this visit's results, reviewed by myself:   EKG Interpretation  Date/Time:    Ventricular Rate:    PR Interval:    QRS Duration:   QT Interval:    QTC Calculation:   R Axis:     Text Interpretation:        Laboratory Studies: Results for orders placed or performed during the hospital encounter of 04/10/16 (from the past 24 hour(s))  Lipase, blood  Status: None   Collection Time: 04/10/16 12:16 AM  Result Value Ref Range   Lipase 49 11 - 51 U/L  Comprehensive metabolic panel     Status: Abnormal   Collection Time: 04/10/16 12:16 AM  Result Value Ref Range   Sodium 138 135 - 145 mmol/L   Potassium 3.8 3.5 - 5.1 mmol/L   Chloride 105 101 - 111 mmol/L   CO2 26 22 - 32 mmol/L   Glucose, Bld 98 65 - 99 mg/dL   BUN 21 (H) 6 - 20 mg/dL   Creatinine, Ser 1.61 0.61 - 1.24 mg/dL   Calcium 9.1 8.9 - 09.6 mg/dL   Total  Protein 7.1 6.5 - 8.1 g/dL   Albumin 3.8 3.5 - 5.0 g/dL   AST 30 15 - 41 U/L   ALT 51 17 - 63 U/L   Alkaline Phosphatase 120 38 - 126 U/L   Total Bilirubin 0.4 0.3 - 1.2 mg/dL   GFR calc non Af Amer >60 >60 mL/min   GFR calc Af Amer >60 >60 mL/min   Anion gap 7 5 - 15  Urinalysis, Routine w reflex microscopic     Status: Abnormal   Collection Time: 04/10/16 12:16 AM  Result Value Ref Range   Color, Urine YELLOW YELLOW   APPearance CLOUDY (A) CLEAR   Specific Gravity, Urine 1.014 1.005 - 1.030   pH 7.0 5.0 - 8.0   Glucose, UA NEGATIVE NEGATIVE mg/dL   Hgb urine dipstick NEGATIVE NEGATIVE   Bilirubin Urine NEGATIVE NEGATIVE   Ketones, ur NEGATIVE NEGATIVE mg/dL   Protein, ur NEGATIVE NEGATIVE mg/dL   Nitrite NEGATIVE NEGATIVE   Leukocytes, UA NEGATIVE NEGATIVE  CBC with Differential     Status: Abnormal   Collection Time: 04/10/16 12:16 AM  Result Value Ref Range   WBC 7.6 4.0 - 10.5 K/uL   RBC 4.87 4.22 - 5.81 MIL/uL   Hemoglobin 16.4 13.0 - 17.0 g/dL   HCT 04.5 40.9 - 81.1 %   MCV 95.7 78.0 - 100.0 fL   MCH 33.7 26.0 - 34.0 pg   MCHC 35.2 30.0 - 36.0 g/dL   RDW 91.4 78.2 - 95.6 %   Platelets 223 150 - 400 K/uL   Neutrophils Relative % 50 %   Neutro Abs 3.8 1.7 - 7.7 K/uL   Lymphocytes Relative 34 %   Lymphs Abs 2.6 0.7 - 4.0 K/uL   Monocytes Relative 14 %   Monocytes Absolute 1.1 (H) 0.1 - 1.0 K/uL   Eosinophils Relative 2 %   Eosinophils Absolute 0.2 0.0 - 0.7 K/uL   Basophils Relative 0 %   Basophils Absolute 0.0 0.0 - 0.1 K/uL   Imaging Studies: No results found.  ED COURSE  Nursing notes and initial vitals signs, including pulse oximetry, reviewed.  Vitals:   04/10/16 0001 04/10/16 0002 04/10/16 0100  BP: (!) 146/98  140/90  Pulse: 60  (!) 52  Resp: 18  11  Temp: 97.6 F (36.4 C)    TempSrc: Oral    SpO2: 100%  100%  Weight:  200 lb (90.7 kg)   Height:  5\' 8"  (1.727 m)    2:24 AM Patient has had incomplete relief despite GI cocktail, Zofran,  Protonix and Carafate. He states in the past GI cocktails have usually provided significant relief. He admits to taking Percocet and Xanax despite not having legal prescriptions for these (per the state controlled substances database the patient has received no prescriptions for controlled substances in the  past year). He denies alcohol use for the past 3 days.   2:40 AM The patient states he is feeling better and ready to go home. He is requesting prescription refills for Zofran, Protonix and Carafate. We will also provide GI referral.  PROCEDURES    ED DIAGNOSES     ICD-9-CM ICD-10-CM   1. Epigastric abdominal pain 789.06 R10.13     I personally performed the services described in this documentation, which was scribed in my presence. The recorded information has been reviewed and is accurate.     Paula LibraJohn Jensyn Cambria, MD 04/10/16 [id number]    Paula LibraJohn Chamberlain Steinborn, MD 04/10/16 [id number]

## 2016-04-10 NOTE — ED Notes (Signed)
Patient continues have pain in his epigastric region. MD aware and verbal orders carried out.

## 2016-04-10 NOTE — ED Triage Notes (Signed)
Pt c/o epigastric pain with n/v for the last two hours, hx of ulcers

## 2019-08-18 ENCOUNTER — Other Ambulatory Visit: Payer: Self-pay

## 2019-08-18 ENCOUNTER — Encounter (HOSPITAL_BASED_OUTPATIENT_CLINIC_OR_DEPARTMENT_OTHER): Payer: Self-pay

## 2019-08-18 ENCOUNTER — Emergency Department (HOSPITAL_BASED_OUTPATIENT_CLINIC_OR_DEPARTMENT_OTHER)
Admission: EM | Admit: 2019-08-18 | Discharge: 2019-08-18 | Disposition: A | Discharge status: Home / Self Care | Payer: Self-pay | Attending: Emergency Medicine | Admitting: Emergency Medicine

## 2019-08-18 DIAGNOSIS — W268XXA Contact with other sharp object(s), not elsewhere classified, initial encounter: Secondary | ICD-10-CM | POA: Insufficient documentation

## 2019-08-18 DIAGNOSIS — Y929 Unspecified place or not applicable: Secondary | ICD-10-CM | POA: Insufficient documentation

## 2019-08-18 DIAGNOSIS — Z23 Encounter for immunization: Secondary | ICD-10-CM | POA: Insufficient documentation

## 2019-08-18 DIAGNOSIS — H9209 Otalgia, unspecified ear: Secondary | ICD-10-CM | POA: Insufficient documentation

## 2019-08-18 DIAGNOSIS — Y999 Unspecified external cause status: Secondary | ICD-10-CM | POA: Insufficient documentation

## 2019-08-18 DIAGNOSIS — Z20822 Contact with and (suspected) exposure to covid-19: Secondary | ICD-10-CM | POA: Insufficient documentation

## 2019-08-18 DIAGNOSIS — J069 Acute upper respiratory infection, unspecified: Secondary | ICD-10-CM

## 2019-08-18 DIAGNOSIS — Y939 Activity, unspecified: Secondary | ICD-10-CM | POA: Insufficient documentation

## 2019-08-18 DIAGNOSIS — J3489 Other specified disorders of nose and nasal sinuses: Secondary | ICD-10-CM | POA: Insufficient documentation

## 2019-08-18 DIAGNOSIS — S91011A Laceration without foreign body, right ankle, initial encounter: Secondary | ICD-10-CM | POA: Insufficient documentation

## 2019-08-18 DIAGNOSIS — J029 Acute pharyngitis, unspecified: Secondary | ICD-10-CM | POA: Insufficient documentation

## 2019-08-18 LAB — SARS CORONAVIRUS 2 BY RT PCR (HOSPITAL ORDER, PERFORMED IN ~~LOC~~ HOSPITAL LAB): SARS Coronavirus 2: NEGATIVE

## 2019-08-18 MED ORDER — TETANUS-DIPHTH-ACELL PERTUSSIS 5-2.5-18.5 LF-MCG/0.5 IM SUSP
0.5000 mL | Freq: Once | INTRAMUSCULAR | Status: AC
  Administered 2019-08-18: 0.5 mL via INTRAMUSCULAR
  Filled 2019-08-18: qty 0.5

## 2019-08-18 MED ORDER — LIDOCAINE VISCOUS HCL 2 % MT SOLN
15.0000 mL | Freq: Once | OROMUCOSAL | Status: AC
  Administered 2019-08-18: 15 mL via OROMUCOSAL
  Filled 2019-08-18: qty 15

## 2019-08-18 MED ORDER — LIDOCAINE-EPINEPHRINE 1 %-1:100000 IJ SOLN
INTRAMUSCULAR | Status: AC
  Administered 2019-08-18: 1 mL
  Filled 2019-08-18: qty 1

## 2019-08-18 MED ORDER — BACITRACIN ZINC 500 UNIT/GM EX OINT
TOPICAL_OINTMENT | Freq: Two times a day (BID) | CUTANEOUS | Status: DC
  Administered 2019-08-18: 1 via TOPICAL

## 2019-08-18 NOTE — ED Triage Notes (Addendum)
Pt states he cut ankle on broken porcelain soap dish-lac noted to lateral ankle with bleeding-4x4/kling dsg applied-NAD-steady gait-pt also added c/o flu like sx to be seen for

## 2019-08-18 NOTE — ED Provider Notes (Signed)
[facility] EMERGENCY DEPARTMENT Provider Note   CSN: [id number] Arrival date & time: 08/18/19  1317     History Chief Complaint  Patient presents with  . Extremity Laceration  . Cough    Kurt Lindsey is a 39 y.o. male.  HPI   39 year old male presents with laceration to the right lateral ankle.  He cut his ankle on a soap dish that had fallen and broken on the ground.  He denies any retained foreign bodies.  He denies any difficulty ambulating, decreased range of motion, numbness, tingling.  He also notes that for the last week he has had nasal congestion, rhinorrhea, sore throat and ear pain.  Patient denies any fevers, chills, cough, sputum production.   Past Medical History:  Diagnosis Date  . Seizures (HCC)    last seizure was 10/2014  . Substance abuse (HCC)   . Ulcer     There are no problems to display for this patient.   Past Surgical History:  Procedure Laterality Date  . CHOLECYSTECTOMY         No family history on file.  Social History   Tobacco Use  . Smoking status: Former Smoker    Types: Cigarettes  . Smokeless tobacco: Never Used  Vaping Use  . Vaping Use: Every day  Substance Use Topics  . Alcohol use: Yes    Comment: weekly  . Drug use: No    Home Medications Prior to Admission medications   Medication Sig Start Date End Date Taking? Authorizing Provider  LANSOPRAZOLE PO Take by mouth.    [provider]  ondansetron (ZOFRAN-ODT) 8 MG disintegrating tablet Take 1 tablet (8 mg total) by mouth every 8 (eight) hours as needed for nausea or vomiting. 04/10/16   Molpus, John, MD  pantoprazole (PROTONIX) 40 MG tablet Take one tablet daily at least 30 minutes before first dose of Carafate. 04/10/16   Molpus, John, MD  phenytoin (DILANTIN) 100 MG ER capsule Take 1 capsule (100 mg total) by mouth 3 (three) times daily. 09/20/11 09/19/12  Lorre Nick, MD  sucralfate (CARAFATE) 1 g tablet Take 1 tablet (1 g total) by mouth 4  (four) times daily -  with meals and at bedtime. 04/10/16   Molpus, Jonny Ruiz, MD    Allergies    Patient has no known allergies.  Review of Systems   Review of Systems  Constitutional: Negative for chills and fever.  HENT: Positive for ear pain, rhinorrhea and sore throat.   Respiratory: Negative for shortness of breath.   Cardiovascular: Negative for chest pain.  Gastrointestinal: Negative for abdominal pain, nausea and vomiting.  Skin: Positive for wound.  All other systems reviewed and are negative.   Physical Exam Updated Vital Signs BP (!) 151/85 (BP Location: Right Arm)   Pulse 77   Temp 98.4 F (36.9 C) (Oral)   Resp 18   Ht 5\' 8"  (1.727 m)   Wt 99.8 kg   SpO2 98%   BMI 33.45 kg/m   Physical Exam Vitals and nursing note reviewed.  Constitutional:      Appearance: He is well-developed.  HENT:     Head: Normocephalic and atraumatic.     Right Ear: Tympanic membrane normal.     Left Ear: Tympanic membrane normal.     Nose: Nose normal.     Mouth/Throat:     Lips: Pink.     Tongue: No lesions.     Palate: No mass.  Eyes:  Conjunctiva/sclera: Conjunctivae normal.  Cardiovascular:     Rate and Rhythm: Normal rate and regular rhythm.     Pulses:          Dorsalis pedis pulses are 2+ on the right side.     Heart sounds: Normal heart sounds. No murmur heard.   Pulmonary:     Effort: Pulmonary effort is normal. No respiratory distress.     Breath sounds: Normal breath sounds. No wheezing or rales.  Abdominal:     General: Bowel sounds are normal. There is no distension.     Palpations: Abdomen is soft.     Tenderness: There is no abdominal tenderness.  Musculoskeletal:        General: No tenderness or deformity. Normal range of motion.     Cervical back: Neck supple.     Right ankle: Laceration present. No deformity or ecchymosis. No tenderness. Normal range of motion. Normal pulse.     Right foot: Normal. Normal range of motion. No bony tenderness.        Feet:  Skin:    General: Skin is warm and dry.     Findings: No erythema or rash.  Neurological:     Mental Status: He is alert and oriented to person, place, and time.  Psychiatric:        Behavior: Behavior normal.     ED Results / Procedures / Treatments   Labs (all labs ordered are listed, but only abnormal results are displayed) Labs Reviewed  SARS CORONAVIRUS 2 BY RT PCR (HOSPITAL ORDER, PERFORMED IN [facility])    EKG None  Radiology No results found.  Procedures .Marland KitchenLaceration Repair  Date/Time: 08/18/2019 4:46 PM Performed by: Clayborne Artist, PA-C Authorized by: Clayborne Artist, PA-C   Consent:    Consent obtained:  Verbal   Consent given by:  Patient   Risks discussed:  Infection, need for additional repair, pain, poor cosmetic result and poor wound healing   Alternatives discussed:  No treatment and delayed treatment Universal protocol:    Procedure explained and questions answered to patient or proxy's satisfaction: yes     Relevant documents present and verified: yes     Test results available and properly labeled: yes     Imaging studies available: yes     Required blood products, implants, devices, and special equipment available: yes     Site/side marked: yes     Immediately prior to procedure, a time out was called: yes     Patient identity confirmed:  Verbally with patient Anesthesia (see MAR for exact dosages):    Anesthesia method:  Local infiltration   Local anesthetic:  Lidocaine 1% WITH epi Laceration details:    Location:  Foot   Foot location:  R ankle   Length (cm):  4   Depth (mm):  10 Repair type:    Repair type:  Simple Pre-procedure details:    Preparation:  Patient was prepped and draped in usual sterile fashion Exploration:    Hemostasis achieved with:  Epinephrine   Wound exploration: wound explored through full range of motion and entire depth of wound probed and visualized     Wound extent: no foreign  bodies/material noted, no muscle damage noted, no nerve damage noted, no tendon damage noted and no vascular damage noted     Contaminated: no   Treatment:    Area cleansed with:  Betadine   Amount of cleaning:  Standard   Irrigation solution:  Sterile  saline   Irrigation volume:  500   Irrigation method:  Pressure wash Skin repair:    Repair method:  Sutures   Suture size:  4-0   Suture technique:  Simple interrupted   Number of sutures:  5 Approximation:    Approximation:  Close Post-procedure details:    Dressing:  Antibiotic ointment and non-adherent dressing   Patient tolerance of procedure:  Tolerated well, no immediate complications   (including critical care time)  Medications Ordered in ED Medications  lidocaine (XYLOCAINE) 2 % viscous mouth solution 15 mL (has no administration in time range)  lidocaine-EPINEPHrine (XYLOCAINE W/EPI) 1 %-1:100000 (with pres) injection (has no administration in time range)  Tdap (BOOSTRIX) injection 0.5 mL (has no administration in time range)    ED Course  I have reviewed the triage vital signs and the nursing notes.  Pertinent labs & imaging results that were available during my care of the patient were reviewed by me and considered in my medical decision making (see chart for details).    MDM Rules/Calculators/A&P                          Patient presented with multiple complaints.  He laceration to the right lateral ankle, 4 cm, gaping, bleeding controlled.  Wound closed with sutures.  No visible foreign body.  Patient has full active range of motion of right ankle, neurovascular intact distally.  Discussed imaging and patient was agreeable to foregoing imaging at this time.  Very low suspicion for retained foreign body or fracture.  Regarding patient's URI symptoms, Covid negative.  He is very well-appearing, no acute distress, nontoxic, non-lethargic.  Vital signs stable.  Likely viral URI.  Patient given return precautions.   Tetanus updated.  Patient ready and stable for discharge.   At this time there does not appear to be any evidence of an acute emergency medical condition and the patient appears stable for discharge with appropriate outpatient follow up.Diagnosis was discussed with patient who verbalizes understanding and is agreeable to discharge.    Final Clinical Impression(s) / ED Diagnoses Final diagnoses:  None    Rx / DC Orders ED Discharge Orders    None       Clayborne Artist, PA-C 08/18/19 2300    Virgina Norfolk, DO 08/18/19 2302

## 2019-08-18 NOTE — Discharge Instructions (Addendum)
Clean wound twice daily with water and soap and apply a small amount of antibiotic ointment and a clean dry dressing.  Return to the emergency room in 10 to 14 days for suture removal.  Return sooner for new or worsening symptoms or concerns, such as redness, swelling, chest pain, shortness of breath, fevers or any concerns at all.

## 2019-09-03 ENCOUNTER — Encounter (HOSPITAL_BASED_OUTPATIENT_CLINIC_OR_DEPARTMENT_OTHER): Payer: Self-pay | Admitting: *Deleted

## 2019-09-03 ENCOUNTER — Emergency Department (HOSPITAL_BASED_OUTPATIENT_CLINIC_OR_DEPARTMENT_OTHER)
Admission: EM | Admit: 2019-09-03 | Discharge: 2019-09-03 | Disposition: A | Discharge status: Home / Self Care | Payer: Self-pay | Attending: Emergency Medicine | Admitting: Emergency Medicine

## 2019-09-03 ENCOUNTER — Other Ambulatory Visit: Payer: Self-pay

## 2019-09-03 DIAGNOSIS — Z4802 Encounter for removal of sutures: Secondary | ICD-10-CM | POA: Insufficient documentation

## 2019-09-03 DIAGNOSIS — Z87891 Personal history of nicotine dependence: Secondary | ICD-10-CM | POA: Insufficient documentation

## 2019-09-03 NOTE — ED Provider Notes (Signed)
[facility] EMERGENCY DEPARTMENT Provider Note   CSN: [id number] Arrival date & time: 09/03/19  1545     History Chief Complaint  Patient presents with  . Suture / Staple Removal    Kurt Lindsey is a 39 y.o. male with past medical history who presents for evaluation of suture removal.  Had #5 sutures placed to his right lateral malleolus.  No bleeding or drainage.  No fevers or chills.  Has been healing well.  Ambulatory without difficulty.  The history is provided by the patient and medical records. No language interpreter was used.  Suture / Staple Removal This is a new problem. The current episode started more than 1 week ago. The problem occurs constantly. The problem has not changed since onset.Pertinent negatives include no chest pain, no abdominal pain, no headaches and no shortness of breath. Nothing aggravates the symptoms. Nothing relieves the symptoms. He has tried nothing for the symptoms. The treatment provided no relief.       Past Medical History:  Diagnosis Date  . Seizures (HCC)    last seizure was 10/2014  . Substance abuse (HCC)   . Ulcer     There are no problems to display for this patient.   Past Surgical History:  Procedure Laterality Date  . CHOLECYSTECTOMY         No family history on file.  Social History   Tobacco Use  . Smoking status: Former Smoker    Types: Cigarettes  . Smokeless tobacco: Never Used  Vaping Use  . Vaping Use: Every day  Substance Use Topics  . Alcohol use: Yes    Comment: weekly  . Drug use: No    Home Medications Prior to Admission medications   Medication Sig Start Date End Date Taking? Authorizing Provider  LANSOPRAZOLE PO Take by mouth.    [provider]  ondansetron (ZOFRAN-ODT) 8 MG disintegrating tablet Take 1 tablet (8 mg total) by mouth every 8 (eight) hours as needed for nausea or vomiting. 04/10/16   Molpus, John, MD  pantoprazole (PROTONIX) 40 MG tablet Take one tablet  daily at least 30 minutes before first dose of Carafate. 04/10/16   Molpus, John, MD  phenytoin (DILANTIN) 100 MG ER capsule Take 1 capsule (100 mg total) by mouth 3 (three) times daily. 09/20/11 09/19/12  Lorre Nick, MD  sucralfate (CARAFATE) 1 g tablet Take 1 tablet (1 g total) by mouth 4 (four) times daily -  with meals and at bedtime. 04/10/16   Molpus, Jonny Ruiz, MD    Allergies    Patient has no known allergies.  Review of Systems   Review of Systems  Constitutional: Negative.   HENT: Negative.   Respiratory: Negative.  Negative for shortness of breath.   Cardiovascular: Negative.  Negative for chest pain.  Gastrointestinal: Negative.  Negative for abdominal pain.  Genitourinary: Negative.   Musculoskeletal: Negative.   Skin: Positive for wound.  Neurological: Negative.  Negative for headaches.  All other systems reviewed and are negative.   Physical Exam Updated Vital Signs BP (!) 130/93 (BP Location: Left Arm)   Pulse 85   Temp 98.1 F (36.7 C) (Oral)   Resp 16   Ht 5\' 8"  (1.727 m)   Wt 98.2 kg   SpO2 98%   BMI 32.90 kg/m   Physical Exam Vitals and nursing note reviewed.  Constitutional:      General: He is not in acute distress.    Appearance: He is well-developed. He is not  ill-appearing, toxic-appearing or diaphoretic.  HENT:     Head: Normocephalic and atraumatic.     Nose: Nose normal.     Mouth/Throat:     Mouth: Mucous membranes are moist.  Eyes:     Pupils: Pupils are equal, round, and reactive to light.  Cardiovascular:     Rate and Rhythm: Normal rate and regular rhythm.     Pulses: Normal pulses.     Heart sounds: Normal heart sounds.  Pulmonary:     Effort: Pulmonary effort is normal. No respiratory distress.     Breath sounds: Normal breath sounds.  Abdominal:     General: Bowel sounds are normal. There is no distension.     Palpations: Abdomen is soft.     Tenderness: There is no abdominal tenderness.  Musculoskeletal:        General: Normal  range of motion.     Cervical back: Normal range of motion and neck supple.     Comments: Moves all 4 extremities without difficulty. No bony tenderness  Skin:    General: Skin is warm and dry.     Capillary Refill: Capillary refill takes less than 2 seconds.     Comments: #5 sutures to right lateral malleolus. No bleeding or drainage. No surrounding erythema or warmth.   Neurological:     Mental Status: He is alert.     Cranial Nerves: Cranial nerves are intact.     Sensory: Sensation is intact.     Motor: Motor function is intact.     Coordination: Coordination is intact.     Comments: Ambulatory without difficulty    ED Results / Procedures / Treatments   Labs (all labs ordered are listed, but only abnormal results are displayed) Labs Reviewed - No data to display  EKG None  Radiology No results found.  Procedures .Suture Removal  Date/Time: 09/03/2019 5:59 PM Performed by: Ralph Leyden A, PA-C Authorized by: Linwood Dibbles, PA-C   Consent:    Consent obtained:  Verbal   Consent given by:  Patient   Risks discussed:  Bleeding, pain and wound separation   Alternatives discussed:  No treatment, delayed treatment, alternative treatment, observation and referral Location:    Location:  Lower extremity   Lower extremity location:  Ankle   Ankle location:  R ankle Procedure details:    Wound appearance:  No signs of infection, good wound healing and clean   Number of sutures removed:  5   Number of staples removed:  0 Post-procedure details:    Post-removal:  Antibiotic ointment applied and Band-Aid applied   Patient tolerance of procedure:  Tolerated well, no immediate complications   (including critical care time)  Medications Ordered in ED Medications - No data to display  ED Course  I have reviewed the triage vital signs and the nursing notes.  Pertinent labs & imaging results that were available during my care of the patient were reviewed by me and  considered in my medical decision making (see chart for details).  39 year old presents for evaluation of suture removal.  Wound appears well-healing.  No active bleeding or drainage.  No surrounding erythema or warmth.  No bony tenderness.  Ambulatory with out difficulty.  Neurovascularly intact.  #5 sutures removed without difficulty.  The patient has been appropriately medically screened and/or stabilized in the ED. I have low suspicion for any other emergent medical condition which would require further screening, evaluation or treatment in the ED or require inpatient management.  Patient is hemodynamically stable and in no acute distress.  Patient able to ambulate in department prior to ED.  Evaluation does not show acute pathology that would require ongoing or additional emergent interventions while in the emergency department or further inpatient treatment.  I have discussed the diagnosis with the patient and answered all questions.  Pain is been managed while in the emergency department and patient has no further complaints prior to discharge.  Patient is comfortable with plan discussed in room and is stable for discharge at this time.  I have discussed strict return precautions for returning to the emergency department.  Patient was encouraged to follow-up with PCP/specialist refer to at discharge.    MDM Rules/Calculators/A&P                           Final Clinical Impression(s) / ED Diagnoses Final diagnoses:  Encounter for removal of sutures    Rx / DC Orders ED Discharge Orders    None       Vaneta Hammontree A, PA-C 09/03/19 1801    Maia Plan, MD 09/07/19 1142

## 2019-09-03 NOTE — ED Notes (Signed)
Pt will be sitting outside when we call him to a tx room.

## 2019-09-03 NOTE — Discharge Instructions (Addendum)
Place neosporin to wound  Return for new or worsening symptoms

## 2019-09-03 NOTE — ED Triage Notes (Signed)
Here to have his sutures removed from his right ankle.

## 2023-11-18 ENCOUNTER — Other Ambulatory Visit: Payer: Self-pay

## 2023-11-18 ENCOUNTER — Emergency Department (HOSPITAL_COMMUNITY)
Admission: EM | Admit: 2023-11-18 | Discharge: 2023-11-18 | Discharge status: Against Medical Advice | Payer: Self-pay | Attending: Emergency Medicine | Admitting: Emergency Medicine

## 2023-11-18 ENCOUNTER — Encounter (HOSPITAL_COMMUNITY): Payer: Self-pay

## 2023-11-18 DIAGNOSIS — S61412A Laceration without foreign body of left hand, initial encounter: Secondary | ICD-10-CM | POA: Insufficient documentation

## 2023-11-18 DIAGNOSIS — W268XXA Contact with other sharp object(s), not elsewhere classified, initial encounter: Secondary | ICD-10-CM | POA: Insufficient documentation

## 2023-11-18 DIAGNOSIS — Z5321 Procedure and treatment not carried out due to patient leaving prior to being seen by health care provider: Secondary | ICD-10-CM | POA: Insufficient documentation

## 2023-11-18 NOTE — ED Triage Notes (Signed)
 PT arrives via POV. Pt was cutting tile and accidentally cut his left hand. Pt has a laceration in between his thumb and index finger. Bleeding is controlled. Unsure when he last had a tetanus shot.
